# Patient Record
Sex: Male | Born: 2000 | Hispanic: Yes | Marital: Single | Smoking: Never smoker
Health system: Southern US, Community
[De-identification: ages and names within clinical notes are randomized; demographics above are authoritative.]

## PROBLEM LIST (undated history)

## (undated) DIAGNOSIS — Z952 Presence of prosthetic heart valve: Secondary | ICD-10-CM

## (undated) DIAGNOSIS — G43909 Migraine, unspecified, not intractable, without status migrainosus: Secondary | ICD-10-CM

## (undated) DIAGNOSIS — R569 Unspecified convulsions: Secondary | ICD-10-CM

## (undated) HISTORY — PX: CARDIAC SURGERY: SHX584

---

## 2020-11-25 ENCOUNTER — Emergency Department (HOSPITAL_COMMUNITY)
Admission: EM | Admit: 2020-11-25 | Discharge: 2020-11-25 | Disposition: A | Discharge status: Home / Self Care | Payer: Medicaid Other | Attending: Emergency Medicine | Admitting: Emergency Medicine

## 2020-11-25 ENCOUNTER — Encounter (HOSPITAL_COMMUNITY): Payer: Self-pay | Admitting: Emergency Medicine

## 2020-11-25 ENCOUNTER — Emergency Department (HOSPITAL_COMMUNITY): Payer: Medicaid Other

## 2020-11-25 DIAGNOSIS — R079 Chest pain, unspecified: Secondary | ICD-10-CM

## 2020-11-25 DIAGNOSIS — R011 Cardiac murmur, unspecified: Secondary | ICD-10-CM | POA: Insufficient documentation

## 2020-11-25 DIAGNOSIS — R0789 Other chest pain: Secondary | ICD-10-CM | POA: Diagnosis not present

## 2020-11-25 HISTORY — DX: Presence of prosthetic heart valve: Z95.2

## 2020-11-25 HISTORY — DX: Unspecified convulsions: R56.9

## 2020-11-25 LAB — BASIC METABOLIC PANEL
Anion gap: 7 (ref 5–15)
BUN: 15 mg/dL (ref 6–20)
CO2: 26 mmol/L (ref 22–32)
Calcium: 9.6 mg/dL (ref 8.9–10.3)
Chloride: 108 mmol/L (ref 98–111)
Creatinine, Ser: 0.84 mg/dL (ref 0.61–1.24)
GFR, Estimated: >60 mL/min (ref >60)
Glucose, Bld: 93 mg/dL (ref 70–99)
Potassium: 4.5 mmol/L (ref 3.5–5.1)
Sodium: 141 mmol/L (ref 135–145)

## 2020-11-25 LAB — CBC
HCT: 46.4 % (ref 39.0–52.0)
Hemoglobin: 15.9 g/dL (ref 13.0–17.0)
MCH: 29.9 pg (ref 26.0–34.0)
MCHC: 34.3 g/dL (ref 30.0–36.0)
MCV: 87.4 fL (ref 80.0–100.0)
Platelets: 228 10*3/uL (ref 150–400)
RBC: 5.31 MIL/uL (ref 4.22–5.81)
RDW: 12.4 % (ref 11.5–15.5)
WBC: 5.6 10*3/uL (ref 4.0–10.5)
nRBC: 0 % (ref 0.0–0.2)

## 2020-11-25 LAB — TROPONIN I (HIGH SENSITIVITY)
Troponin I (High Sensitivity): <2 ng/L (ref <18)
Troponin I (High Sensitivity): <2 ng/L (ref <18)

## 2020-11-25 LAB — BRAIN NATRIURETIC PEPTIDE: B Natriuretic Peptide: 37.4 pg/mL (ref 0.0–100.0)

## 2020-11-25 NOTE — ED Provider Notes (Signed)
[facility]-EMERGENCY DEPT Provider Note   CSN: [id number] Arrival date & time: 11/25/20  2505     History Chief Complaint  Patient presents with  . Chest Pain    Carl Calderon is a 20 y.o. male.  HPI  HPI: A 20 year old patient presents for evaluation of chest pain. Initial onset of pain was less than one hour ago. The patient's chest pain is sharp and is not worse with exertion. The patient's chest pain is middle- or left-sided, is not well-localized, is not described as heaviness/pressure/tightness and does not radiate to the arms/jaw/neck. The patient does not complain of nausea and denies diaphoresis. The patient has no history of stroke, has no history of peripheral artery disease, has not smoked in the past 90 days, denies any history of treated diabetes, has no relevant family history of coronary artery disease (first degree relative at less than age 68), is not hypertensive, has no history of hypercholesterolemia and does not have an elevated BMI (>=30).   Patient has history of bicuspid aortic valve, status post replacement when he was a child.  Patient has not seen a cardiologist in over 3 years.  He reports intermittent episodes of chest pain over the last 2 weeks that have become more pronounced.  The pain is left-sided.  The pain is not associated with exertion and come about randomly, even at rest.  The pain will last only for a minute or 2 and then resolve, only for her to return in few seconds.  No pleuritic component to the pain.  Patient denies any cough, fevers, chills, radiation of pain, shortness of breath, palpitations or near fainting.  Past Medical History:  Diagnosis Date  . Aortic valve replaced   . Seizures (HCC)     There are no problems to display for this patient.     No family history on file.  Social History   Tobacco Use  . Smoking status: Never Smoker  . Smokeless tobacco: Never Used  Substance Use  Topics  . Alcohol use: Never  . Drug use: Never    Home Medications Prior to Admission medications   Not on File    Allergies    Patient has no allergy information on record.  Review of Systems   Review of Systems  Constitutional: Positive for activity change.  Respiratory: Negative for shortness of breath.   Cardiovascular: Positive for chest pain. Negative for palpitations.  Neurological: Negative for dizziness.  All other systems reviewed and are negative.   Physical Exam Updated Vital Signs BP 129/76   Pulse (!) 58   Temp 98.2 F (36.8 C) (Oral)   Resp 13   Ht 6' (1.829 m)   Wt 90.7 kg   SpO2 100%   BMI 27.12 kg/m   Physical Exam Vitals and nursing note reviewed.  Constitutional:      Appearance: He is well-developed.  HENT:     Head: Atraumatic.  Cardiovascular:     Rate and Rhythm: Normal rate.     Heart sounds: Murmur heard.   Systolic murmur is present.   Pulmonary:     Effort: Pulmonary effort is normal.  Musculoskeletal:     Cervical back: Neck supple.  Skin:    General: Skin is warm.  Neurological:     Mental Status: He is alert and oriented to person, place, and time.     ED Results / Procedures / Treatments   Labs (all labs ordered are listed, but only  abnormal results are displayed) Labs Reviewed  BASIC METABOLIC PANEL  CBC  BRAIN NATRIURETIC PEPTIDE  TROPONIN I (HIGH SENSITIVITY)  TROPONIN I (HIGH SENSITIVITY)    EKG EKG Interpretation  Date/Time:  Tuesday November 25 2020 08:37:29 EDT Ventricular Rate:  78 PR Interval:  145 QRS Duration: 107 QT Interval:  369 QTC Calculation: 421 R Axis:   89 Text Interpretation: Sinus rhythm S1,S2,S3 pattern RSR' in V1 or V2, right VCD or RVH Borderline ST elevation, anterolateral leads [id number] No old tracing to compare Confirmed by Derwood Kaplan [phone]) on 11/25/2020 10:27:44 AM   Radiology DG Chest 2 View  Result Date: 11/25/2020 CLINICAL DATA:  Chest pain. EXAM: CHEST - 2 VIEW  COMPARISON:  No prior. FINDINGS: Mediastinum is normal. Borderline cardiac enlargement. No pleural effusion or pneumothorax. Thoracic spine scoliosis concave left. No acute bony abnormality. IMPRESSION: 1. Borderline cardiac enlargement. No acute cardiopulmonary disease otherwise noted. 2.  Mild thoracic spine scoliosis. Electronically Signed   By: Maisie Fus  Register   On: 11/25/2020 08:59    Procedures Procedures   Medications Ordered in ED Medications - No data to display  ED Course  I have reviewed the triage vital signs and the nursing notes.  Pertinent labs & imaging results that were available during my care of the patient were reviewed by me and considered in my medical decision making (see chart for details).    MDM Rules/Calculators/A&P HEAR Score: 1                        20 year old comes in a chief complaint of chest pain.  He has history of bicuspid aortic valve, status post replacement.  Has been having intermittent episodes of chest pain over the last 2 weeks that have progressed.  The pain is atypical.  However given his history, we will get troponins to ensure there is no myocardial injury.  Patient has pansystolic murmur.  I cannot review any prior echocardiogram.  He does not appear to be volume overloaded.  Chest x-ray ordered.  EKG showing possible right-sided strain.  Patient does not have any PE risk factors and his Wells score is 0, PERC negative.  We will not pursue PE as the work-up.  If the troponins are fine, we will advised patient to follow-up with cardiology service.  He will probably benefit with echocardiogram.  Final Clinical Impression(s) / ED Diagnoses Final diagnoses:  None    Rx / DC Orders ED Discharge Orders    None       Derwood Kaplan, MD 11/25/20 1042

## 2020-11-25 NOTE — ED Triage Notes (Signed)
Patient here from home reporting chest pain that started 1 week ago. Reports left chest tightness. Hx of aortic valve replacement and seizure disorder. Denies n/v.

## 2020-11-25 NOTE — Discharge Instructions (Signed)
We saw you in the ER for the chest pain.  All of our cardiac workup is normal, including labs, EKG and chest X-RAY are normal. We are not sure what is causing your discomfort, but we feel comfortable sending you home at this time. The workup in the ER is not complete, and you should follow up with Cardiologist as soon as possible.  Please return to the ER if you have worsening chest pain, shortness of breath, fainting.

## 2021-02-06 ENCOUNTER — Emergency Department (HOSPITAL_COMMUNITY): Payer: Medicaid Other

## 2021-02-06 ENCOUNTER — Other Ambulatory Visit: Payer: Self-pay

## 2021-02-06 ENCOUNTER — Encounter (HOSPITAL_COMMUNITY): Payer: Self-pay

## 2021-02-06 ENCOUNTER — Emergency Department (HOSPITAL_COMMUNITY)
Admission: EM | Admit: 2021-02-06 | Discharge: 2021-02-06 | Disposition: A | Discharge status: Home / Self Care | Payer: Medicaid Other | Attending: Emergency Medicine | Admitting: Emergency Medicine

## 2021-02-06 DIAGNOSIS — R569 Unspecified convulsions: Secondary | ICD-10-CM | POA: Insufficient documentation

## 2021-02-06 DIAGNOSIS — Z79899 Other long term (current) drug therapy: Secondary | ICD-10-CM | POA: Diagnosis not present

## 2021-02-06 HISTORY — DX: Migraine, unspecified, not intractable, without status migrainosus: G43.909

## 2021-02-06 LAB — COMPREHENSIVE METABOLIC PANEL
ALT: 23 U/L (ref 0–44)
AST: 21 U/L (ref 15–41)
Albumin: 4.4 g/dL (ref 3.5–5.0)
Alkaline Phosphatase: 47 U/L (ref 38–126)
Anion gap: 5 (ref 5–15)
BUN: 14 mg/dL (ref 6–20)
CO2: 24 mmol/L (ref 22–32)
Calcium: 9 mg/dL (ref 8.9–10.3)
Chloride: 109 mmol/L (ref 98–111)
Creatinine, Ser: 0.82 mg/dL (ref 0.61–1.24)
GFR, Estimated: >60 mL/min (ref >60)
Glucose, Bld: 109 mg/dL — ABNORMAL HIGH (ref 70–99)
Potassium: 3.9 mmol/L (ref 3.5–5.1)
Sodium: 138 mmol/L (ref 135–145)
Total Bilirubin: 0.9 mg/dL (ref 0.3–1.2)
Total Protein: 7.1 g/dL (ref 6.5–8.1)

## 2021-02-06 LAB — CBC WITH DIFFERENTIAL/PLATELET
Abs Immature Granulocytes: 0.03 10*3/uL (ref 0.00–0.07)
Basophils Absolute: 0 10*3/uL (ref 0.0–0.1)
Basophils Relative: 0 %
Eosinophils Absolute: 0.1 10*3/uL (ref 0.0–0.5)
Eosinophils Relative: 1 %
HCT: 42.9 % (ref 39.0–52.0)
Hemoglobin: 15.1 g/dL (ref 13.0–17.0)
Immature Granulocytes: 0 %
Lymphocytes Relative: 12 %
Lymphs Abs: 1.1 10*3/uL (ref 0.7–4.0)
MCH: 29.8 pg (ref 26.0–34.0)
MCHC: 35.2 g/dL (ref 30.0–36.0)
MCV: 84.6 fL (ref 80.0–100.0)
Monocytes Absolute: 0.5 10*3/uL (ref 0.1–1.0)
Monocytes Relative: 5 %
Neutro Abs: 7.6 10*3/uL (ref 1.7–7.7)
Neutrophils Relative %: 82 %
Platelets: 213 10*3/uL (ref 150–400)
RBC: 5.07 MIL/uL (ref 4.22–5.81)
RDW: 12.7 % (ref 11.5–15.5)
WBC: 9.4 10*3/uL (ref 4.0–10.5)
nRBC: 0 % (ref 0.0–0.2)

## 2021-02-06 LAB — CBG MONITORING, ED: Glucose-Capillary: 95 mg/dL (ref 70–99)

## 2021-02-06 LAB — VALPROIC ACID LEVEL: Valproic Acid Lvl: <10 ug/mL — ABNORMAL LOW (ref 50.0–100.0)

## 2021-02-06 MED ORDER — KETOROLAC TROMETHAMINE 30 MG/ML IJ SOLN
30.0000 mg | Freq: Once | INTRAMUSCULAR | Status: AC
  Administered 2021-02-06: 30 mg via INTRAVENOUS
  Filled 2021-02-06: qty 1

## 2021-02-06 MED ORDER — DIVALPROEX SODIUM ER 250 MG PO TB24: 750.0000 mg | ORAL_TABLET | Freq: Every day | ORAL | 0 refills | Status: DC

## 2021-02-06 MED ORDER — VALPROIC ACID 250 MG PO CAPS
750.0000 mg | ORAL_CAPSULE | Freq: Once | ORAL | Status: AC
  Administered 2021-02-06: 750 mg via ORAL
  Filled 2021-02-06: qty 3

## 2021-02-06 NOTE — ED Provider Notes (Signed)
[facility] Provider Note   CSN: [id number] Arrival date & time: 02/06/21  1125     History Chief Complaint  Patient presents with   Seizures    Carl Calderon is a 20 y.o. male.  HPI Patient is a 20 year old male with past medical history significant for epilepsy takes 750 of Depakote daily at bedtime.  States that he missed a dose last night he states that he misses a dose approximately every other month.  He states that today while at the barbershop just prior to arrival in the ER he was sitting in the chair when he started having jerking on both arms.  He states it was his entire body states that he then does not remember anything other than waking up in the ambulance.  He states that he was told that he passed out he did hit his head against the ground is endorsing some headache and neck pain.  No chest pain or shortness of breath no lightheadedness or dizziness. No nausea or vomiting.  No significant other associate symptoms.  Aggravating mitigating factors.    Past Medical History:  Diagnosis Date   Aortic valve replaced    Migraine    Seizures (HCC)     There are no problems to display for this patient.   Past Surgical History:  Procedure Laterality Date   CARDIAC SURGERY         History reviewed. No pertinent family history.  Social History   Tobacco Use   Smoking status: Never   Smokeless tobacco: Never  Vaping Use   Vaping Use: Never used  Substance Use Topics   Alcohol use: Yes    Comment: every once in awhile   Drug use: Never    Home Medications Prior to Admission medications   Medication Sig Start Date End Date Taking? Authorizing Provider  divalproex (DEPAKOTE ER) 250 MG 24 hr tablet Take 3 tablets (750 mg total) by mouth daily. 02/06/21 03/08/21 Yes Lis Savitt S, PA  divalproex (DEPAKOTE) 250 MG DR tablet Take 250 mg by mouth at bedtime. 10/27/20   [provider]  divalproex  (DEPAKOTE) 500 MG DR tablet Take 500 mg by mouth at bedtime. 10/27/20   [provider]  topiramate (TOPAMAX) 25 MG tablet Take 25 tablets by mouth at bedtime. 06/24/20   [provider]    Allergies    Patient has no known allergies.  Review of Systems   Review of Systems  Constitutional:  Negative for chills and fever.  HENT:  Negative for congestion.   Eyes:  Negative for pain.  Respiratory:  Negative for cough and shortness of breath.   Cardiovascular:  Negative for chest pain and leg swelling.  Gastrointestinal:  Negative for abdominal distention, abdominal pain and vomiting.  Genitourinary:  Negative for dysuria.  Musculoskeletal:  Positive for neck pain. Negative for myalgias.  Skin:  Negative for rash.  Neurological:  Positive for seizures and headaches. Negative for dizziness.   Physical Exam Updated Vital Signs BP 119/61   Pulse 80   Temp 98.7 F (37.1 C) (Oral)   Resp 14   Ht 6' (1.829 m)   Wt 90.7 kg   SpO2 100%   BMI 27.12 kg/m   Physical Exam Vitals and nursing note reviewed.  Constitutional:      General: He is not in acute distress. HENT:     Head: Normocephalic.     Comments: There is some bruising overlying the bridge  of the nose with no significant tenderness and some abrasions and bruising to the left forehead.    Nose: Nose normal.     Mouth/Throat:     Mouth: Mucous membranes are moist.  Eyes:     General: No scleral icterus. Cardiovascular:     Rate and Rhythm: Normal rate and regular rhythm.     Pulses: Normal pulses.     Heart sounds: Normal heart sounds.  Pulmonary:     Effort: Pulmonary effort is normal. No respiratory distress.     Breath sounds: No wheezing.  Abdominal:     Palpations: Abdomen is soft.     Tenderness: There is no abdominal tenderness.  Musculoskeletal:     Cervical back: Normal range of motion.     Right lower leg: No edema.     Left lower leg: No edema.  Skin:    General: Skin is warm and dry.      Capillary Refill: Capillary refill takes less than 2 seconds.  Neurological:     Mental Status: He is alert. Mental status is at baseline.     Comments: Alert and oriented to self, place, time and event.   Speech is fluent, clear without dysarthria or dysphasia.   Strength 5/5 in upper/lower extremities   Sensation intact in upper/lower extremities   Normal finger-to-nose and feet tapping.  CN I not tested  CN II grossly intact visual fields bilaterally. Did not visualize posterior eye.  CN III, IV, VI PERRLA and EOMs intact bilaterally  CN V Intact sensation to sharp and light touch to the face  CN VII facial movements symmetric  CN VIII not tested  CN IX, X no uvula deviation, symmetric rise of soft palate  CN XI 5/5 SCM and trapezius strength bilaterally  CN XII Midline tongue protrusion, symmetric L/R movements   Psychiatric:        Mood and Affect: Mood normal.        Behavior: Behavior normal.    ED Results / Procedures / Treatments   Labs (all labs ordered are listed, but only abnormal results are displayed) Labs Reviewed  VALPROIC ACID LEVEL - Abnormal; Notable for the following components:      Result Value   Valproic Acid Lvl <10 (*)    All other components within normal limits  COMPREHENSIVE METABOLIC PANEL - Abnormal; Notable for the following components:   Glucose, Bld 109 (*)    All other components within normal limits  CBC WITH DIFFERENTIAL/PLATELET  CBG MONITORING, ED    EKG EKG Interpretation  Date/Time:  Friday February 06 2021 12:48:19 EDT Ventricular Rate:  84 PR Interval:  166 QRS Duration: 112 QT Interval:  348 QTC Calculation: 411 R Axis:   106 Text Interpretation: Normal sinus rhythm Rightward axis Pulmonary disease pattern Incomplete right bundle branch block diffuse ST elevation c/w early repol No old tracing to compare Confirmed by Pricilla Loveless [phone]) on 02/06/2021 2:01:23 PM  Radiology CT Head Wo Contrast  Result Date:  02/06/2021 CLINICAL DATA:  Head injury after seizure and fall. EXAM: CT HEAD WITHOUT CONTRAST CT CERVICAL SPINE WITHOUT CONTRAST TECHNIQUE: Multidetector CT imaging of the head and cervical spine was performed following the standard protocol without intravenous contrast. Multiplanar CT image reconstructions of the cervical spine were also generated. COMPARISON:  None. FINDINGS: CT HEAD FINDINGS Brain: No evidence of acute infarction, hemorrhage, hydrocephalus, extra-axial collection or mass lesion/mass effect. Vascular: No hyperdense vessel or unexpected calcification. Skull: Normal. Negative for fracture or  focal lesion. Sinuses/Orbits: No acute finding. Other: None. CT CERVICAL SPINE FINDINGS Alignment: Normal. Skull base and vertebrae: No acute fracture. No primary bone lesion or focal pathologic process. Soft tissues and spinal canal: No prevertebral fluid or swelling. No visible canal hematoma. Disc levels:  Normal. Upper chest: Negative. Other: None. IMPRESSION: Normal head CT. Normal cervical spine. Electronically Signed   By: Lupita Raider M.D.   On: 02/06/2021 13:41   CT Cervical Spine Wo Contrast  Result Date: 02/06/2021 CLINICAL DATA:  Head injury after seizure and fall. EXAM: CT HEAD WITHOUT CONTRAST CT CERVICAL SPINE WITHOUT CONTRAST TECHNIQUE: Multidetector CT imaging of the head and cervical spine was performed following the standard protocol without intravenous contrast. Multiplanar CT image reconstructions of the cervical spine were also generated. COMPARISON:  None. FINDINGS: CT HEAD FINDINGS Brain: No evidence of acute infarction, hemorrhage, hydrocephalus, extra-axial collection or mass lesion/mass effect. Vascular: No hyperdense vessel or unexpected calcification. Skull: Normal. Negative for fracture or focal lesion. Sinuses/Orbits: No acute finding. Other: None. CT CERVICAL SPINE FINDINGS Alignment: Normal. Skull base and vertebrae: No acute fracture. No primary bone lesion or focal  pathologic process. Soft tissues and spinal canal: No prevertebral fluid or swelling. No visible canal hematoma. Disc levels:  Normal. Upper chest: Negative. Other: None. IMPRESSION: Normal head CT. Normal cervical spine. Electronically Signed   By: Lupita Raider M.D.   On: 02/06/2021 13:41    Procedures Procedures   Medications Ordered in ED Medications  valproic acid (DEPAKENE) 250 MG capsule 750 mg (has no administration in time range)  ketorolac (TORADOL) 30 MG/ML injection 30 mg (has no administration in time range)    ED Course  I have reviewed the triage vital signs and the nursing notes.  Pertinent labs & imaging results that were available during my care of the patient were reviewed by me and considered in my medical decision making (see chart for details).  Clinical Course as of 02/06/21 1420  Fri Feb 06, 2021  1402 CT head/cspine Without ABN [WF]    Clinical Course User Index [WF] Gailen Shelter, PA   MDM Rules/Calculators/A&P                          Patient is 20 year old male presenting today for symptoms consistent with a seizure that occurred earlier today.  He did fall forward and strike his head against the ground.  He is currently asymptomatic this time apart from headache which has improved significantly.  Neurologically intact on examination.  Does have some bruising around the left forehead and an abrasion to the bridge of his nose.  CT head and CT C-spine without abnormality.  CMP CBC unremarkable.  Valproate level is undetectably low.  I reviewed patient's medication it appears that he is on the immediate release version of Depakote and I suspect the extended release to be more helpful for daytime coverage.  He has not seen a neurologist in several years.  Recommended follow-up with [facility] or [facility] neurology.  Return precautions given including additional seizure activity.  Patient has been without any seizures here in the ER.  Given a dose of Depakote  prior to discharge.  Final Clinical Impression(s) / ED Diagnoses Final diagnoses:  Seizure (HCC)    Rx / DC Orders ED Discharge Orders          Ordered    divalproex (DEPAKOTE ER) 250 MG 24 hr tablet  Daily  02/06/21 1413             Gailen Shelter, [place] 02/06/21 1455    Pricilla Loveless, MD 02/07/21 1538

## 2021-02-06 NOTE — ED Triage Notes (Signed)
Per EMS pt complaining for barbershop and had a seizure that lasted 3 minutes. Pt fell and hit his forehead. Abrasion to forehead. Hx of seizure on medication. No seizure activity with EMS.  Pt had n/v with EMS with 4mg  Zofran given.   20G IV LFA  EMS VS BP 190/120, HR 110, CBG 111, O2 98%

## 2021-02-06 NOTE — Discharge Instructions (Addendum)
Please begin taking the Depakote that I prescribed you tomorrow Please drink plenty of water. Please follow-up with a neurologist.  I given you the information for 2 different neurologist.  Please get plenty of rest.  Avoid any alcohol for the time being.  You may always return to the ER for any new or concerning symptoms including additional seizures.

## 2021-03-09 ENCOUNTER — Other Ambulatory Visit: Payer: Self-pay

## 2021-03-09 ENCOUNTER — Emergency Department (HOSPITAL_COMMUNITY)
Admission: EM | Admit: 2021-03-09 | Discharge: 2021-03-10 | Disposition: A | Discharge status: Home / Self Care | Payer: Medicaid Other | Attending: Emergency Medicine | Admitting: Emergency Medicine

## 2021-03-09 ENCOUNTER — Emergency Department (HOSPITAL_COMMUNITY): Payer: Medicaid Other

## 2021-03-09 ENCOUNTER — Encounter (HOSPITAL_COMMUNITY): Payer: Self-pay

## 2021-03-09 DIAGNOSIS — R197 Diarrhea, unspecified: Secondary | ICD-10-CM | POA: Insufficient documentation

## 2021-03-09 DIAGNOSIS — R1031 Right lower quadrant pain: Secondary | ICD-10-CM | POA: Insufficient documentation

## 2021-03-09 DIAGNOSIS — R112 Nausea with vomiting, unspecified: Secondary | ICD-10-CM | POA: Insufficient documentation

## 2021-03-09 DIAGNOSIS — R1032 Left lower quadrant pain: Secondary | ICD-10-CM | POA: Diagnosis not present

## 2021-03-09 LAB — CBC WITH DIFFERENTIAL/PLATELET
Abs Immature Granulocytes: 0.02 10*3/uL (ref 0.00–0.07)
Basophils Absolute: 0 10*3/uL (ref 0.0–0.1)
Basophils Relative: 0 %
Eosinophils Absolute: 0 10*3/uL (ref 0.0–0.5)
Eosinophils Relative: 0 %
HCT: 46.4 % (ref 39.0–52.0)
Hemoglobin: 16.3 g/dL (ref 13.0–17.0)
Immature Granulocytes: 0 %
Lymphocytes Relative: 13 %
Lymphs Abs: 0.7 10*3/uL (ref 0.7–4.0)
MCH: 30.2 pg (ref 26.0–34.0)
MCHC: 35.1 g/dL (ref 30.0–36.0)
MCV: 85.9 fL (ref 80.0–100.0)
Monocytes Absolute: 0.5 10*3/uL (ref 0.1–1.0)
Monocytes Relative: 10 %
Neutro Abs: 4 10*3/uL (ref 1.7–7.7)
Neutrophils Relative %: 77 %
Platelets: 222 10*3/uL (ref 150–400)
RBC: 5.4 MIL/uL (ref 4.22–5.81)
RDW: 12.6 % (ref 11.5–15.5)
WBC: 5.2 10*3/uL (ref 4.0–10.5)
nRBC: 0 % (ref 0.0–0.2)

## 2021-03-09 LAB — LIPASE, BLOOD: Lipase: 26 U/L (ref 11–51)

## 2021-03-09 LAB — COMPREHENSIVE METABOLIC PANEL
ALT: 20 U/L (ref 0–44)
AST: 20 U/L (ref 15–41)
Albumin: 5 g/dL (ref 3.5–5.0)
Alkaline Phosphatase: 50 U/L (ref 38–126)
Anion gap: 11 (ref 5–15)
BUN: 17 mg/dL (ref 6–20)
CO2: 28 mmol/L (ref 22–32)
Calcium: 10.2 mg/dL (ref 8.9–10.3)
Chloride: 100 mmol/L (ref 98–111)
Creatinine, Ser: 0.9 mg/dL (ref 0.61–1.24)
GFR, Estimated: >60 mL/min (ref >60)
Glucose, Bld: 102 mg/dL — ABNORMAL HIGH (ref 70–99)
Potassium: 4 mmol/L (ref 3.5–5.1)
Sodium: 139 mmol/L (ref 135–145)
Total Bilirubin: 2.4 mg/dL — ABNORMAL HIGH (ref 0.3–1.2)
Total Protein: 8.4 g/dL — ABNORMAL HIGH (ref 6.5–8.1)

## 2021-03-09 LAB — URINALYSIS, ROUTINE W REFLEX MICROSCOPIC
Bilirubin Urine: NEGATIVE
Glucose, UA: NEGATIVE mg/dL
Hgb urine dipstick: NEGATIVE
Ketones, ur: 5 mg/dL — AB
Leukocytes,Ua: NEGATIVE
Nitrite: NEGATIVE
Protein, ur: NEGATIVE mg/dL
Specific Gravity, Urine: 1.031 — ABNORMAL HIGH (ref 1.005–1.030)
pH: 5 (ref 5.0–8.0)

## 2021-03-09 MED ORDER — ONDANSETRON HCL 4 MG PO TABS: 4.0000 mg | ORAL_TABLET | Freq: Four times a day (QID) | ORAL | 0 refills | Status: DC

## 2021-03-09 MED ORDER — ONDANSETRON HCL 4 MG/2ML IJ SOLN
4.0000 mg | Freq: Once | INTRAMUSCULAR | Status: AC
  Administered 2021-03-09: 4 mg via INTRAVENOUS
  Filled 2021-03-09: qty 2

## 2021-03-09 MED ORDER — SODIUM CHLORIDE 0.9 % IV BOLUS
1000.0000 mL | Freq: Once | INTRAVENOUS | Status: AC
  Administered 2021-03-09: 1000 mL via INTRAVENOUS

## 2021-03-09 MED ORDER — IOHEXOL 350 MG/ML SOLN
80.0000 mL | Freq: Once | INTRAVENOUS | Status: AC | PRN
  Administered 2021-03-09: 80 mL via INTRAVENOUS

## 2021-03-09 NOTE — ED Notes (Signed)
Pt back from CT

## 2021-03-09 NOTE — Discharge Instructions (Addendum)
Overall suspect that you do have some food poisoning.  Lab work and imaging today was unremarkable.  Suspect that symptoms will resolve on their own.  Take Zofran as needed for nausea.

## 2021-03-09 NOTE — ED Notes (Signed)
Patient transported to CT 

## 2021-03-09 NOTE — ED Triage Notes (Signed)
Pt reports abdomen pain since yesterday after eating at 3:30pm. Pt states he has been vomiting, not able to keep food/drink down.  Pt reports headache and feeling like he is going to pass out when vomiting.

## 2021-03-09 NOTE — ED Provider Notes (Signed)
[facility]-EMERGENCY DEPT Provider Note   CSN: [id number] Arrival date & time: 03/09/21  1711     History Chief Complaint  Patient presents with   Abdominal Pain    Carl Calderon is a 20 y.o. male.  The history is provided by the patient.  Abdominal Pain Pain location:  LLQ and RLQ Pain quality: aching   Pain radiates to:  Does not radiate Pain severity:  Mild Onset quality:  Gradual Duration:  2 days Timing:  Constant Progression:  Worsening Chronicity:  New Context: suspicious food intake (possibly)   Relieved by:  Nothing Worsened by:  Nothing Associated symptoms: diarrhea (twice), nausea and vomiting   Associated symptoms: no anorexia, no chest pain, no chills, no constipation, no cough, no dysuria, no fever, no hematuria, no shortness of breath and no sore throat   Risk factors: has not had multiple surgeries       Past Medical History:  Diagnosis Date   Aortic valve replaced    Migraine    Seizures (HCC)     There are no problems to display for this patient.   Past Surgical History:  Procedure Laterality Date   CARDIAC SURGERY         History reviewed. No pertinent family history.  Social History   Tobacco Use   Smoking status: Never   Smokeless tobacco: Never  Vaping Use   Vaping Use: Never used  Substance Use Topics   Alcohol use: Yes    Comment: every once in awhile   Drug use: Never    Home Medications Prior to Admission medications   Medication Sig Start Date End Date Taking? Authorizing Provider  ondansetron (ZOFRAN) 4 MG tablet Take 1 tablet (4 mg total) by mouth every 6 (six) hours. 03/09/21  Yes Eleno Weimar, DO  divalproex (DEPAKOTE ER) 250 MG 24 hr tablet Take 3 tablets (750 mg total) by mouth daily. 02/06/21 03/08/21  Gailen Shelter, PA  divalproex (DEPAKOTE) 250 MG DR tablet Take 250 mg by mouth at bedtime. 10/27/20   [provider]  divalproex (DEPAKOTE) 500 MG DR tablet Take 500  mg by mouth at bedtime. 10/27/20   [provider]  topiramate (TOPAMAX) 25 MG tablet Take 25 tablets by mouth at bedtime. 06/24/20   [provider]    Allergies    Patient has no known allergies.  Review of Systems   Review of Systems  Constitutional:  Negative for chills and fever.  HENT:  Negative for ear pain and sore throat.   Eyes:  Negative for pain and visual disturbance.  Respiratory:  Negative for cough and shortness of breath.   Cardiovascular:  Negative for chest pain and palpitations.  Gastrointestinal:  Positive for abdominal pain, diarrhea (twice), nausea and vomiting. Negative for anorexia and constipation.  Genitourinary:  Negative for dysuria and hematuria.  Musculoskeletal:  Negative for arthralgias and back pain.  Skin:  Negative for color change and rash.  Neurological:  Negative for seizures and syncope.  All other systems reviewed and are negative.  Physical Exam Updated Vital Signs BP 109/73   Pulse 63   Temp 98.3 F (36.8 C) (Oral)   Resp 18   Ht 6' (1.829 m)   Wt 104.3 kg   SpO2 98%   BMI 31.19 kg/m   Physical Exam Vitals and nursing note reviewed.  Constitutional:      General: He is not in acute distress.    Appearance: He is well-developed.  He is not ill-appearing.  HENT:     Head: Normocephalic and atraumatic.     Mouth/Throat:     Mouth: Mucous membranes are moist.  Eyes:     Extraocular Movements: Extraocular movements intact.     Conjunctiva/sclera: Conjunctivae normal.     Pupils: Pupils are equal, round, and reactive to light.  Cardiovascular:     Rate and Rhythm: Normal rate and regular rhythm.     Heart sounds: Normal heart sounds. No murmur heard. Pulmonary:     Effort: Pulmonary effort is normal. No respiratory distress.     Breath sounds: Normal breath sounds.  Abdominal:     General: There is no distension.     Palpations: Abdomen is soft.     Tenderness: There is abdominal tenderness in the right lower  quadrant and suprapubic area. There is no guarding or rebound.  Musculoskeletal:     Cervical back: Neck supple.  Skin:    General: Skin is warm and dry.     Capillary Refill: Capillary refill takes less than 2 seconds.  Neurological:     General: No focal deficit present.     Mental Status: He is alert.    ED Results / Procedures / Treatments   Labs (all labs ordered are listed, but only abnormal results are displayed) Labs Reviewed  COMPREHENSIVE METABOLIC PANEL - Abnormal; Notable for the following components:      Result Value   Glucose, Bld 102 (*)    Total Protein 8.4 (*)    Total Bilirubin 2.4 (*)    All other components within normal limits  URINALYSIS, ROUTINE W REFLEX MICROSCOPIC - Abnormal; Notable for the following components:   APPearance HAZY (*)    Specific Gravity, Urine 1.031 (*)    Ketones, ur 5 (*)    All other components within normal limits  CBC WITH DIFFERENTIAL/PLATELET  LIPASE, BLOOD    EKG None  Radiology CT ABDOMEN PELVIS W CONTRAST  Result Date: 03/09/2021 CLINICAL DATA:  Right lower quadrant pain, nausea, vomiting and diarrhea EXAM: CT ABDOMEN AND PELVIS WITH CONTRAST TECHNIQUE: Multidetector CT imaging of the abdomen and pelvis was performed using the standard protocol following bolus administration of intravenous contrast. CONTRAST:  1mL OMNIPAQUE IOHEXOL 350 MG/ML SOLN COMPARISON:  None. FINDINGS: Lower chest: Lung bases are clear. Normal heart size. No pericardial effusion. Hepatobiliary: No worrisome focal liver lesions. Smooth liver surface contour. Normal hepatic attenuation. Normal gallbladder and biliary tree. Pancreas: No pancreatic ductal dilatation or surrounding inflammatory changes. Spleen: Normal in size. No concerning splenic lesions. Adrenals/Urinary Tract: Normal adrenal glands. Kidneys are normally located with symmetric enhancement. No suspicious renal lesion, urolithiasis or hydronephrosis. Urinary bladder unremarkable for degree of  distension. Stomach/Bowel: Distal esophagus, stomach and duodenal sweep are unremarkable. No small bowel wall thickening or dilatation. No evidence of obstruction. A normal appendix is visualized. No colonic dilatation or wall thickening. Vascular/Lymphatic: No significant vascular findings are present. No enlarged abdominal or pelvic lymph nodes. Reproductive: The prostate and seminal vesicles are unremarkable. Other: No abdominopelvic free fluid or free gas. No bowel containing hernias. Musculoskeletal: No acute osseous abnormality or suspicious osseous lesion. Transitional lumbosacral anatomy, anatomic variant. IMPRESSION: Acute no acute CT abnormality provide a cause for patient's symptoms. Specifically, normal appendix in the right lower quadrant. Electronically Signed   By: Kreg Shropshire M.D.   On: 03/09/2021 22:53    Procedures Procedures   Medications Ordered in ED Medications  sodium chloride 0.9 % bolus 1,000 mL (1,000  mLs Intravenous New Bag/Given 03/09/21 2213)  ondansetron (ZOFRAN) injection 4 mg (4 mg Intravenous Given 03/09/21 2213)  iohexol (OMNIPAQUE) 350 MG/ML injection 80 mL (80 mLs Intravenous Contrast Given 03/09/21 2233)    ED Course  I have reviewed the triage vital signs and the nursing notes.  Pertinent labs & imaging results that were available during my care of the patient were reviewed by me and considered in my medical decision making (see chart for details).    MDM Rules/Calculators/A&P                           Carl Calderon is here for nausea, vomiting, diarrhea.  Normal vitals.  No fever.  Tenderness in the right lower quadrant.  Concern for possible appendicitis but suspect foodborne poisoning.  No testicular pain.  Lab work showed no significant anemia, electrolyte abnormality here kidney injury.  CT scan showed no evidence of appendicitis or colitis.  No evidence of cholecystitis.  Overall suspect foodborne poisoning.  No UTI.  Felt better after IV  fluids and IV Zofran.  Discharged in ED in good condition.  Prescribe Zofran.  This chart was dictated using voice recognition software.  Despite best efforts to proofread,  errors can occur which can change the documentation meaning.   Final Clinical Impression(s) / ED Diagnoses Final diagnoses:  Nausea vomiting and diarrhea    Rx / DC Orders ED Discharge Orders          Ordered    ondansetron (ZOFRAN) 4 MG tablet  Every 6 hours        03/09/21 2256             Virgina Norfolk, DO 03/09/21 2258

## 2021-03-09 NOTE — ED Provider Notes (Signed)
Emergency Medicine Provider Triage Evaluation Note  Carl Calderon , a 20 y.o. male  was evaluated in triage.  Pt complains of nvd and abd pain. Had [place] food yesterday.  Review of Systems  Positive: Nvd, abd pain Negative: fever  Physical Exam  BP (!) 130/97   Pulse 87   Temp 98.6 F (37 C) (Oral)   Resp 18   Ht 6' (1.829 m)   Wt 104.3 kg   SpO2 99%   BMI 31.19 kg/m  Gen:   Awake, no distress   Resp:  Normal effort  MSK:   Moves extremities without difficulty  Other:    Medical Decision Making  Medically screening exam initiated at 5:49 PM.  Appropriate orders placed.  Carl Calderon was informed that the remainder of the evaluation will be completed by another provider, this initial triage assessment does not replace that evaluation, and the importance of remaining in the ED until their evaluation is complete.     Karrie Meres, PA-C 03/09/21 1749    Milagros Loll, MD 03/10/21 [phone]

## 2021-07-05 ENCOUNTER — Encounter (HOSPITAL_COMMUNITY): Payer: Self-pay

## 2021-07-05 ENCOUNTER — Other Ambulatory Visit: Payer: Self-pay

## 2021-07-05 ENCOUNTER — Emergency Department (HOSPITAL_COMMUNITY)
Admission: EM | Admit: 2021-07-05 | Discharge: 2021-07-05 | Disposition: A | Discharge status: Home / Self Care | Payer: Medicaid Other | Attending: Emergency Medicine | Admitting: Emergency Medicine

## 2021-07-05 DIAGNOSIS — R569 Unspecified convulsions: Secondary | ICD-10-CM | POA: Diagnosis present

## 2021-07-05 DIAGNOSIS — R400 Somnolence: Secondary | ICD-10-CM | POA: Insufficient documentation

## 2021-07-05 LAB — CBC WITH DIFFERENTIAL/PLATELET
Abs Immature Granulocytes: 0.04 10*3/uL (ref 0.00–0.07)
Basophils Absolute: 0 10*3/uL (ref 0.0–0.1)
Basophils Relative: 0 %
Eosinophils Absolute: 0 10*3/uL (ref 0.0–0.5)
Eosinophils Relative: 0 %
HCT: 44.3 % (ref 39.0–52.0)
Hemoglobin: 15 g/dL (ref 13.0–17.0)
Immature Granulocytes: 0 %
Lymphocytes Relative: 10 %
Lymphs Abs: 1.1 10*3/uL (ref 0.7–4.0)
MCH: 29.2 pg (ref 26.0–34.0)
MCHC: 33.9 g/dL (ref 30.0–36.0)
MCV: 86.2 fL (ref 80.0–100.0)
Monocytes Absolute: 0.8 10*3/uL (ref 0.1–1.0)
Monocytes Relative: 7 %
Neutro Abs: 9.3 10*3/uL — ABNORMAL HIGH (ref 1.7–7.7)
Neutrophils Relative %: 83 %
Platelets: 232 10*3/uL (ref 150–400)
RBC: 5.14 MIL/uL (ref 4.22–5.81)
RDW: 12.2 % (ref 11.5–15.5)
WBC: 11.2 10*3/uL — ABNORMAL HIGH (ref 4.0–10.5)
nRBC: 0 % (ref 0.0–0.2)

## 2021-07-05 LAB — COMPREHENSIVE METABOLIC PANEL
ALT: 42 U/L (ref 0–44)
AST: 27 U/L (ref 15–41)
Albumin: 4.5 g/dL (ref 3.5–5.0)
Alkaline Phosphatase: 47 U/L (ref 38–126)
Anion gap: 7 (ref 5–15)
BUN: 15 mg/dL (ref 6–20)
CO2: 27 mmol/L (ref 22–32)
Calcium: 9.4 mg/dL (ref 8.9–10.3)
Chloride: 105 mmol/L (ref 98–111)
Creatinine, Ser: 0.95 mg/dL (ref 0.61–1.24)
GFR, Estimated: >60 mL/min (ref >60)
Glucose, Bld: 130 mg/dL — ABNORMAL HIGH (ref 70–99)
Potassium: 4.1 mmol/L (ref 3.5–5.1)
Sodium: 139 mmol/L (ref 135–145)
Total Bilirubin: 0.9 mg/dL (ref 0.3–1.2)
Total Protein: 7.5 g/dL (ref 6.5–8.1)

## 2021-07-05 LAB — CBG MONITORING, ED: Glucose-Capillary: 139 mg/dL — ABNORMAL HIGH (ref 70–99)

## 2021-07-05 LAB — VALPROIC ACID LEVEL: Valproic Acid Lvl: 27 ug/mL — ABNORMAL LOW (ref 50.0–100.0)

## 2021-07-05 LAB — ETHANOL: Alcohol, Ethyl (B): <10 mg/dL (ref <10)

## 2021-07-05 MED ORDER — SODIUM CHLORIDE 0.9 % IV BOLUS
1000.0000 mL | Freq: Once | INTRAVENOUS | Status: AC
  Administered 2021-07-05: 04:00:00 1000 mL via INTRAVENOUS

## 2021-07-05 MED ORDER — DIVALPROEX SODIUM ER 500 MG PO TB24
750.0000 mg | ORAL_TABLET | Freq: Every day | ORAL | Status: DC
  Administered 2021-07-05: 06:00:00 750 mg via ORAL
  Filled 2021-07-05: qty 1

## 2021-07-05 NOTE — ED Provider Notes (Signed)
[facility]-EMERGENCY DEPT Provider Note   CSN: [id number] Arrival date & time: 07/05/21  6811     History Chief Complaint  Patient presents with   pseudo seizures    Carl Calderon is a 20 y.o. male.  Patient is a 20 year old male with history of seizures, migraines.  Patient presenting today for evaluation of seizure activity.  He was apparently hanging out with several friends when he began shaking all over.  The shaking initiated in his legs, then moved to the rest of his body.  According to his girlfriend who witnessed the event, this episode lasted for approximately 5 minutes, then resolved spontaneously.  She reports that he was conscious and speaking during the episode.  Patient denies alcohol or drug use.  He denies any recent illness.  No fevers or chills.  The history is provided by the patient.      Past Medical History:  Diagnosis Date   Aortic valve replaced    Migraine    Seizures (HCC)     There are no problems to display for this patient.   Past Surgical History:  Procedure Laterality Date   CARDIAC SURGERY         History reviewed. No pertinent family history.  Social History   Tobacco Use   Smoking status: Never   Smokeless tobacco: Never  Vaping Use   Vaping Use: Never used  Substance Use Topics   Alcohol use: Yes    Comment: every once in awhile   Drug use: Never    Home Medications Prior to Admission medications   Medication Sig Start Date End Date Taking? Authorizing Provider  divalproex (DEPAKOTE ER) 250 MG 24 hr tablet Take 3 tablets (750 mg total) by mouth daily. 02/06/21 03/08/21  Gailen Shelter, PA  divalproex (DEPAKOTE) 250 MG DR tablet Take 250 mg by mouth at bedtime. 10/27/20   [provider]  divalproex (DEPAKOTE) 500 MG DR tablet Take 500 mg by mouth at bedtime. 10/27/20   [provider]  ondansetron (ZOFRAN) 4 MG tablet Take 1 tablet (4 mg total) by mouth every 6 (six)  hours. 03/09/21   Curatolo, Adam, DO  topiramate (TOPAMAX) 25 MG tablet Take 25 tablets by mouth at bedtime. 06/24/20   [provider]    Allergies    Patient has no known allergies.  Review of Systems   Review of Systems  All other systems reviewed and are negative.  Physical Exam Updated Vital Signs BP (!) 153/82 (BP Location: Right Arm)   Pulse (!) 130   Temp 99.5 F (37.5 C) (Oral)   Resp 16   SpO2 99%   Physical Exam Vitals and nursing note reviewed.  Constitutional:      General: He is not in acute distress.    Appearance: He is well-developed. He is not diaphoretic.     Comments: Patient is somnolent, but arousable and appropriate.  He follows commands normally.  He moves all 4 extremities.  HENT:     Head: Normocephalic and atraumatic.  Cardiovascular:     Rate and Rhythm: Normal rate and regular rhythm.     Heart sounds: No murmur heard.   No friction rub.  Pulmonary:     Effort: Pulmonary effort is normal. No respiratory distress.     Breath sounds: Normal breath sounds. No wheezing or rales.  Abdominal:     General: Bowel sounds are normal. There is no distension.     Palpations: Abdomen  is soft.     Tenderness: There is no abdominal tenderness.  Musculoskeletal:        General: Normal range of motion.     Cervical back: Normal range of motion and neck supple.  Skin:    General: Skin is warm and dry.  Neurological:     General: No focal deficit present.     Mental Status: He is oriented to person, place, and time.     Cranial Nerves: No cranial nerve deficit.     Motor: No weakness.     Coordination: Coordination normal.    ED Results / Procedures / Treatments   Labs (all labs ordered are listed, but only abnormal results are displayed) Labs Reviewed  CBG MONITORING, ED - Abnormal; Notable for the following components:      Result Value   Glucose-Capillary 139 (*)    All other components within normal limits     EKG None  Radiology No results found.  Procedures Procedures   Medications Ordered in ED Medications  sodium chloride 0.9 % bolus 1,000 mL (has no administration in time range)    ED Course  I have reviewed the triage vital signs and the nursing notes.  Pertinent labs & imaging results that were available during my care of the patient were reviewed by me and considered in my medical decision making (see chart for details).    MDM Rules/Calculators/A&P  Patient brought here for evaluation after an apparent seizure that occurred this evening.  He was with friends at the time that it occurred and the episode was witnessed by his girlfriend.  By the time he arrived here, the episode had resolved and he is now back to his baseline.  Work-up shows no laboratory abnormality.  His Depakote level is 27 which is subtherapeutic.  Upon further questioning, he admits to not taking his dose of Depakote this evening.  He was given a dose here in the ER, but appears stable for discharge at this time.  He tells me he tried to see a neurologist recently, however got lost and was unable to make it to the appointment.  I will make another referral for him to follow-up.  Final Clinical Impression(s) / ED Diagnoses Final diagnoses:  None    Rx / DC Orders ED Discharge Orders     None        Geoffery Lyons, MD 07/05/21 [phone]

## 2021-07-05 NOTE — Discharge Instructions (Addendum)
Continue Depakote as previously prescribed.  Follow-up with neurology in the next week.  A referral has been placed for you to follow-up with [facility] neurology.  If you have not heard from them by Tuesday, call to make these arrangements.  Return to the emergency department in the meantime if you experience any new and/or concerning issues.

## 2021-07-05 NOTE — ED Triage Notes (Signed)
Pt BIB EMS with reports of pseudo seizures. EMS states that pt was shivering when they arrived but was able to talk to them the whole time. Pt states that he did not take his prescribed medication yesterday.

## 2021-08-17 ENCOUNTER — Ambulatory Visit: Payer: Medicaid Other | Admitting: Neurology

## 2021-08-18 ENCOUNTER — Encounter: Payer: Self-pay | Admitting: Neurology

## 2021-09-16 ENCOUNTER — Encounter (HOSPITAL_COMMUNITY): Payer: Self-pay | Admitting: *Deleted

## 2021-09-16 ENCOUNTER — Other Ambulatory Visit: Payer: Self-pay

## 2021-09-16 ENCOUNTER — Emergency Department (HOSPITAL_COMMUNITY)
Admission: EM | Admit: 2021-09-16 | Discharge: 2021-09-16 | Disposition: A | Discharge status: Home / Self Care | Payer: Medicaid Other | Attending: Emergency Medicine | Admitting: Emergency Medicine

## 2021-09-16 ENCOUNTER — Emergency Department (HOSPITAL_COMMUNITY): Payer: Medicaid Other

## 2021-09-16 DIAGNOSIS — S0081XA Abrasion of other part of head, initial encounter: Secondary | ICD-10-CM | POA: Diagnosis not present

## 2021-09-16 DIAGNOSIS — S3992XA Unspecified injury of lower back, initial encounter: Secondary | ICD-10-CM | POA: Diagnosis present

## 2021-09-16 DIAGNOSIS — S20229A Contusion of unspecified back wall of thorax, initial encounter: Secondary | ICD-10-CM | POA: Insufficient documentation

## 2021-09-16 DIAGNOSIS — X58XXXA Exposure to other specified factors, initial encounter: Secondary | ICD-10-CM | POA: Insufficient documentation

## 2021-09-16 DIAGNOSIS — T148XXA Other injury of unspecified body region, initial encounter: Secondary | ICD-10-CM

## 2021-09-16 DIAGNOSIS — Y9389 Activity, other specified: Secondary | ICD-10-CM | POA: Insufficient documentation

## 2021-09-16 DIAGNOSIS — G40909 Epilepsy, unspecified, not intractable, without status epilepticus: Secondary | ICD-10-CM | POA: Insufficient documentation

## 2021-09-16 LAB — I-STAT CHEM 8, ED
BUN: 15 mg/dL (ref 6–20)
Calcium, Ion: 1.19 mmol/L (ref 1.15–1.40)
Chloride: 105 mmol/L (ref 98–111)
Creatinine, Ser: 0.9 mg/dL (ref 0.61–1.24)
Glucose, Bld: 122 mg/dL — ABNORMAL HIGH (ref 70–99)
HCT: 44 % (ref 39.0–52.0)
Hemoglobin: 15 g/dL (ref 13.0–17.0)
Potassium: 3.8 mmol/L (ref 3.5–5.1)
Sodium: 139 mmol/L (ref 135–145)
TCO2: 25 mmol/L (ref 22–32)

## 2021-09-16 LAB — CBG MONITORING, ED: Glucose-Capillary: 128 mg/dL — ABNORMAL HIGH (ref 70–99)

## 2021-09-16 MED ORDER — SODIUM CHLORIDE 0.9 % IV SOLN: INTRAVENOUS | Status: DC | PRN

## 2021-09-16 MED ORDER — DIVALPROEX SODIUM ER 500 MG PO TB24: 1000.0000 mg | ORAL_TABLET | Freq: Every day | ORAL | 1 refills | Status: DC

## 2021-09-16 MED ORDER — DIVALPROEX SODIUM ER 500 MG PO TB24
1000.0000 mg | ORAL_TABLET | Freq: Every day | ORAL | Status: DC
  Administered 2021-09-16: 1000 mg via ORAL
  Filled 2021-09-16: qty 2

## 2021-09-16 MED ORDER — VALPROATE SODIUM 100 MG/ML IV SOLN
500.0000 mg | Freq: Once | INTRAVENOUS | Status: AC
  Administered 2021-09-16: 500 mg via INTRAVENOUS
  Filled 2021-09-16: qty 5

## 2021-09-16 MED ORDER — ACETAMINOPHEN 325 MG PO TABS
650.0000 mg | ORAL_TABLET | Freq: Once | ORAL | Status: AC
  Administered 2021-09-16: 650 mg via ORAL
  Filled 2021-09-16: qty 2

## 2021-09-16 MED ORDER — VALPROATE SODIUM 100 MG/ML IV SOLN
500.0000 mg | Freq: Once | INTRAVENOUS | Status: DC
  Filled 2021-09-16: qty 5

## 2021-09-16 NOTE — Discharge Instructions (Signed)
I sent in a refill of your seizure medication.  Make sure to take it regularly.  Follow-up with your neurologist as planned.  Return as needed for recurrent episodes

## 2021-09-16 NOTE — ED Provider Notes (Signed)
[facility]-EM[facility]ENCY DEPT Provider Note   CSN: [id number] Arrival date & time: 09/16/21  2024     History  Chief Complaint  Patient presents with   Seizures    Carl Calderon is a 21 y.o. male.   Seizures  Patient presents to the ED for evaluation of a seizure.  He has a history of seizure disorder and is supposed to be on Depakote.  Patient states he ran out of his medications a few days ago and has not been able to get a refill from his neurologist.  Patient was playing a computer game and suddenly had a seizure.  Patient was noted to be incontinent of urine he hit his head.  Now he is complaining some back pain.  Patient is now alert and awake.  He denies any recent fevers or chills.  No vomiting or diarrhea.  No other concerns.  Home Medications Prior to Admission medications   Medication Sig Start Date End Date Taking? Authorizing Provider  divalproex (DEPAKOTE ER) 500 MG 24 hr tablet Take 2 tablets (1,000 mg total) by mouth daily. 09/16/21 10/16/21  Linwood Dibbles, MD      Allergies    Patient has no known allergies.    Review of Systems   Review of Systems  Constitutional:  Negative for fever.  Neurological:  Positive for seizures.   Physical Exam Updated Vital Signs BP 119/76    Pulse 97    Temp 99.7 F (37.6 C) (Oral)    Resp 18    Wt 108.9 kg    SpO2 97%    BMI 32.55 kg/m  Physical Exam Vitals and nursing note reviewed.  Constitutional:      General: He is not in acute distress.    Appearance: He is well-developed.  HENT:     Head: Normocephalic.     Comments: Abrasion noted to the forehead    Right Ear: External ear normal.     Left Ear: External ear normal.  Eyes:     General: No scleral icterus.       Right eye: No discharge.        Left eye: No discharge.     Conjunctiva/sclera: Conjunctivae normal.  Neck:     Trachea: No tracheal deviation.  Cardiovascular:     Rate and Rhythm: Normal rate and regular rhythm.   Pulmonary:     Effort: Pulmonary effort is normal. No respiratory distress.     Breath sounds: Normal breath sounds. No stridor. No wheezing or rales.  Abdominal:     General: Bowel sounds are normal. There is no distension.     Palpations: Abdomen is soft.     Tenderness: There is no abdominal tenderness. There is no guarding or rebound.  Musculoskeletal:        General: No deformity.     Cervical back: Normal and neck supple.     Thoracic back: Tenderness present. No deformity.     Lumbar back: Tenderness present. No deformity.  Skin:    General: Skin is warm and dry.     Findings: No rash.  Neurological:     General: No focal deficit present.     Mental Status: He is alert.     Cranial Nerves: No cranial nerve deficit (no facial droop, extraocular movements intact, no slurred speech).     Sensory: No sensory deficit.     Motor: No abnormal muscle tone or seizure activity.     Coordination: Coordination  normal.  Psychiatric:        Mood and Affect: Mood normal.    ED Results / Procedures / Treatments   Labs (all labs ordered are listed, but only abnormal results are displayed) Labs Reviewed  CBG MONITORING, ED - Abnormal; Notable for the following components:      Result Value   Glucose-Capillary 128 (*)    All other components within normal limits  I-STAT CHEM 8, ED - Abnormal; Notable for the following components:   Glucose, Bld 122 (*)    All other components within normal limits  VALPROIC ACID LEVEL    EKG None  Radiology DG Thoracic Spine 2 View  Result Date: 09/16/2021 CLINICAL DATA:  Fall, pain EXAM: THORACIC SPINE 2 VIEWS COMPARISON:  None. FINDINGS: There is no evidence of thoracic spine fracture. Alignment is normal. No other significant bone abnormalities are identified. IMPRESSION: Negative. Electronically Signed   By: Charlett Nose M.D.   On: 09/16/2021 21:32   DG Lumbar Spine Complete  Result Date: 09/16/2021 CLINICAL DATA:  Fall, low back pain EXAM:  LUMBAR SPINE - COMPLETE 4+ VIEW COMPARISON:  None. FINDINGS: There is no evidence of lumbar spine fracture. Alignment is normal. Intervertebral disc spaces are maintained. IMPRESSION: Negative. Electronically Signed   By: Charlett Nose M.D.   On: 09/16/2021 21:32   DG Wrist Complete Right  Result Date: 09/16/2021 CLINICAL DATA:  Fall, wrist pain EXAM: RIGHT WRIST - COMPLETE 3+ VIEW COMPARISON:  None. FINDINGS: There is no evidence of fracture or dislocation. There is no evidence of arthropathy or other focal bone abnormality. Soft tissues are unremarkable. IMPRESSION: Negative. Electronically Signed   By: Charlett Nose M.D.   On: 09/16/2021 21:33    Procedures Procedures    Medications Ordered in ED Medications  valproate (DEPACON) 500 mg in dextrose 5 % 50 mL IVPB (has no administration in time range)  0.9 %  sodium chloride infusion (has no administration in time range)  divalproex (DEPAKOTE ER) 24 hr tablet 1,000 mg (has no administration in time range)  acetaminophen (TYLENOL) tablet 650 mg (650 mg Oral Given 09/16/21 2105)    ED Course/ Medical Decision Making/ A&P Clinical Course as of 09/16/21 2226  Wed Sep 16, 2021  2208 Wrist x-ray negative [JK]  2208 Lumbar spine x-ray negative [JK]  2208 Thoracic spine negative [JK]  2208 I-STAT without acute findings [JK]    Clinical Course User Index [JK] Linwood Dibbles, MD                           Medical Decision Making Amount and/or Complexity of Data Reviewed External Data Reviewed: notes.    Details: Neurology visit notes Labs: ordered. Decision-making details documented in ED Course. Radiology: ordered.  Risk OTC drugs. Prescription drug management.   Seizure disorder Patient presented with recurrent seizure.  He does have a history of this.  Patient had been out of his medications for a few days.  Patient returned back to baseline.  No signs of infection or neurologic dysfunction at this time.  Patient was given an IV loading  dose of Depakote.  I also gave him an oral dose of his daily medication.  Patient was monitored in the ED and no recurrent seizures.  Abrasion/contusion Patient did have an abrasion on his forehead as well as some pain in his lower back.  X-rays do not show any signs of fracture.  Injury is consistent with soft tissue  injury.  Patient can take over-the-counter medications as needed.  Follow-up as needed.        Final Clinical Impression(s) / ED Diagnoses Final diagnoses:  Seizure disorder (HCC)  Abrasion  Contusion of back, unspecified laterality, initial encounter    Rx / DC Orders ED Discharge Orders          Ordered    divalproex (DEPAKOTE ER) 500 MG 24 hr tablet  Daily        09/16/21 2225              Linwood Dibbles, MD 09/16/21 2228

## 2021-09-16 NOTE — ED Triage Notes (Addendum)
Pt with known seizure disorder has been out of his medications for 3 days due to pharmacy issue, pt attempted to reach his neurologist in regards to this but was unsuccessful.  Today pt had a seizure while gaming online. person that he was gaming with said that he went quiet and it sounded like pt was banging around so friend got in car and went to check on pt and found pt unconscious and with abrasion to forehead and having been incontinent of urine and called 911.  Pt was alert by the time ems go there. Pt reports lower back pain but is alert and oriented on arrival to ED

## 2021-09-16 NOTE — ED Notes (Signed)
Patient transported to X-ray 

## 2021-09-16 NOTE — ED Notes (Signed)
Seizure precautions in place, bed rails up and seizure pads applied

## 2021-09-17 ENCOUNTER — Telehealth (HOSPITAL_COMMUNITY): Payer: Self-pay | Admitting: Student

## 2021-09-17 MED ORDER — DIVALPROEX SODIUM ER 500 MG PO TB24: 1000.0000 mg | ORAL_TABLET | Freq: Every day | ORAL | 0 refills | Status: DC

## 2021-09-17 NOTE — Telephone Encounter (Signed)
Patient was seen yesterday and prescribed divalproex 1000 mg daily, he went to pick up prescription today and pharmacy reports they do not have prescription, this has been resent to the [facility] on [address].

## 2022-04-30 ENCOUNTER — Emergency Department (HOSPITAL_COMMUNITY)
Admission: EM | Admit: 2022-04-30 | Discharge: 2022-05-01 | Disposition: A | Discharge status: Home / Self Care | Payer: Medicaid Other | Attending: Emergency Medicine | Admitting: Emergency Medicine

## 2022-04-30 ENCOUNTER — Other Ambulatory Visit: Payer: Self-pay

## 2022-04-30 ENCOUNTER — Emergency Department (HOSPITAL_COMMUNITY): Payer: Medicaid Other

## 2022-04-30 DIAGNOSIS — R000 Tachycardia, unspecified: Secondary | ICD-10-CM | POA: Insufficient documentation

## 2022-04-30 DIAGNOSIS — R569 Unspecified convulsions: Secondary | ICD-10-CM | POA: Insufficient documentation

## 2022-04-30 DIAGNOSIS — M542 Cervicalgia: Secondary | ICD-10-CM | POA: Insufficient documentation

## 2022-04-30 DIAGNOSIS — M25511 Pain in right shoulder: Secondary | ICD-10-CM | POA: Insufficient documentation

## 2022-04-30 LAB — COMPREHENSIVE METABOLIC PANEL
ALT: 51 U/L — ABNORMAL HIGH (ref 0–44)
AST: 34 U/L (ref 15–41)
Albumin: 4.3 g/dL (ref 3.5–5.0)
Alkaline Phosphatase: 53 U/L (ref 38–126)
Anion gap: 10 (ref 5–15)
BUN: 10 mg/dL (ref 6–20)
CO2: 24 mmol/L (ref 22–32)
Calcium: 9.6 mg/dL (ref 8.9–10.3)
Chloride: 106 mmol/L (ref 98–111)
Creatinine, Ser: 1.07 mg/dL (ref 0.61–1.24)
GFR, Estimated: >60 mL/min (ref >60)
Glucose, Bld: 100 mg/dL — ABNORMAL HIGH (ref 70–99)
Potassium: 4.1 mmol/L (ref 3.5–5.1)
Sodium: 140 mmol/L (ref 135–145)
Total Bilirubin: 1.5 mg/dL — ABNORMAL HIGH (ref 0.3–1.2)
Total Protein: 7.1 g/dL (ref 6.5–8.1)

## 2022-04-30 LAB — CBC WITH DIFFERENTIAL/PLATELET
Abs Immature Granulocytes: 0.02 10*3/uL (ref 0.00–0.07)
Basophils Absolute: 0 10*3/uL (ref 0.0–0.1)
Basophils Relative: 1 %
Eosinophils Absolute: 0.1 10*3/uL (ref 0.0–0.5)
Eosinophils Relative: 1 %
HCT: 42.9 % (ref 39.0–52.0)
Hemoglobin: 15.2 g/dL (ref 13.0–17.0)
Immature Granulocytes: 0 %
Lymphocytes Relative: 21 %
Lymphs Abs: 1.3 10*3/uL (ref 0.7–4.0)
MCH: 29.6 pg (ref 26.0–34.0)
MCHC: 35.4 g/dL (ref 30.0–36.0)
MCV: 83.6 fL (ref 80.0–100.0)
Monocytes Absolute: 0.4 10*3/uL (ref 0.1–1.0)
Monocytes Relative: 6 %
Neutro Abs: 4.4 10*3/uL (ref 1.7–7.7)
Neutrophils Relative %: 71 %
Platelets: 223 10*3/uL (ref 150–400)
RBC: 5.13 MIL/uL (ref 4.22–5.81)
RDW: 12.3 % (ref 11.5–15.5)
WBC: 6.2 10*3/uL (ref 4.0–10.5)
nRBC: 0 % (ref 0.0–0.2)

## 2022-04-30 LAB — MAGNESIUM: Magnesium: 2.5 mg/dL — ABNORMAL HIGH (ref 1.7–2.4)

## 2022-04-30 MED ORDER — DIVALPROEX SODIUM ER 500 MG PO TB24: 1000.0000 mg | ORAL_TABLET | Freq: Every day | ORAL | 0 refills | Status: AC

## 2022-04-30 MED ORDER — VALPROATE SODIUM 100 MG/ML IV SOLN
500.0000 mg | Freq: Once | INTRAVENOUS | Status: AC
  Administered 2022-04-30: 500 mg via INTRAVENOUS
  Filled 2022-04-30: qty 5

## 2022-04-30 MED ORDER — KETOROLAC TROMETHAMINE 15 MG/ML IJ SOLN
15.0000 mg | Freq: Once | INTRAMUSCULAR | Status: AC
  Administered 2022-04-30: 15 mg via INTRAVENOUS
  Filled 2022-04-30: qty 1

## 2022-04-30 MED ORDER — ACETAMINOPHEN 500 MG PO TABS: 1000.0000 mg | ORAL_TABLET | Freq: Four times a day (QID) | ORAL | Status: DC | PRN

## 2022-04-30 MED ORDER — LACTATED RINGERS IV BOLUS
1000.0000 mL | Freq: Once | INTRAVENOUS | Status: AC
  Administered 2022-04-30: 1000 mL via INTRAVENOUS

## 2022-04-30 MED ORDER — DIVALPROEX SODIUM ER 500 MG PO TB24
1000.0000 mg | ORAL_TABLET | Freq: Once | ORAL | Status: AC
  Administered 2022-04-30: 1000 mg via ORAL
  Filled 2022-04-30: qty 2

## 2022-04-30 NOTE — ED Triage Notes (Signed)
Pt BIB EMS after having a seizure and falling and hitting his head. Patient's mother stated to EMS that he ran out of his Depakote 3 days ago, and has not gotten it refilled. Pt sustained a laceration to his R axillary. Patient was vomiting prior to EMS arrival and on route, EMS administered 4mg  of zofran. Patient does complain of head, neck and R arm pain 10/10.  VSS w/ EMS.

## 2022-04-30 NOTE — Discharge Instructions (Addendum)
You have a scapular fracture on the right side.  Sling for comfort.  Please follow-up with orthopedics.  I provided permission below to call and schedule an appointment.   Follow-up with your neurologist.  Continue your home seizure medications.  Return to the ED as needed.  Stay well-hydrated.  Use Tylenol/Motrin as needed for discomfort.

## 2022-04-30 NOTE — ED Notes (Signed)
Called ortho regarding placement of shoulder sling for this patient.

## 2022-04-30 NOTE — ED Provider Notes (Signed)
[facility] EMERGENCY DEPARTMENT Provider Note   CSN: [id number] Arrival date & time: 04/30/22  1803     History  Chief Complaint  Patient presents with   Seizures    Carl Calderon is a 21 y.o. male. Presenting after seizure that occurred sitting at home at the kitchen table.  Witnessed by sister and mother.  He does have history of prior seizures and has been out of his medication for the past 3 days and has not taken it.  He takes Depakote. He was sitting at a high top table and had only tensed bilateral upper extremities and fell out of the chair onto his right side as he began having full body convulsions.  Family states this appears similar to patient's prior seizure.  It lasted approximately 5 minutes and resolved without intervention. Patient denies any preceding symptoms including fever, recent illness, nausea, vomiting, decreased p.o. day, or trauma. Patient was in normal state of health just prior to the event. He now reports history of headache and right shoulder pain.  HPI     Home Medications Prior to Admission medications   Medication Sig Start Date End Date Taking? Authorizing Provider  divalproex (DEPAKOTE ER) 500 MG 24 hr tablet Take 2 tablets (1,000 mg total) by mouth daily. 04/30/22 05/30/22  Kela Millin, MD      Allergies    Patient has no known allergies.    Review of Systems   Review of Systems  Constitutional:  Negative for chills and fever.  HENT:  Negative for ear pain and sore throat.   Eyes:  Negative for pain and visual disturbance.  Respiratory:  Negative for cough and shortness of breath.   Cardiovascular:  Negative for chest pain and palpitations.  Gastrointestinal:  Negative for abdominal pain and vomiting.  Genitourinary:  Negative for dysuria and hematuria.  Musculoskeletal:  Negative for arthralgias and back pain.  Skin:  Negative for color change and rash.  Neurological:  Positive for seizures and  headaches. Negative for syncope.  All other systems reviewed and are negative.   Physical Exam Updated Vital Signs BP 123/67   Pulse 86   Temp 98.7 F (37.1 C) (Oral)   Resp 20   SpO2 95%  Physical Exam Vitals and nursing note reviewed.  Constitutional:      General: He is not in acute distress.    Appearance: He is well-developed.  HENT:     Head: Normocephalic and atraumatic.     Comments: Tenderness to posterior scalp, no deformity Eyes:     Conjunctiva/sclera: Conjunctivae normal.  Cardiovascular:     Rate and Rhythm: Normal rate and regular rhythm.     Heart sounds: No murmur heard. Pulmonary:     Effort: Pulmonary effort is normal. No respiratory distress.     Breath sounds: Normal breath sounds.  Abdominal:     Palpations: Abdomen is soft.     Tenderness: There is no abdominal tenderness.  Musculoskeletal:        General: Tenderness (Right posterior shoulder) present. No swelling.     Cervical back: Neck supple. Tenderness (left paraspinal tenderness, no midline tenderness) present.  Skin:    General: Skin is warm and dry.     Capillary Refill: Capillary refill takes less than 2 seconds.  Neurological:     General: No focal deficit present.     Mental Status: He is alert and oriented to person, place, and time. Mental status is at baseline.  Psychiatric:        Mood and Affect: Mood normal.     ED Results / Procedures / Treatments   Labs (all labs ordered are listed, but only abnormal results are displayed) Labs Reviewed  COMPREHENSIVE METABOLIC PANEL - Abnormal; Notable for the following components:      Result Value   Glucose, Bld 100 (*)    ALT 51 (*)    Total Bilirubin 1.5 (*)    All other components within normal limits  MAGNESIUM - Abnormal; Notable for the following components:   Magnesium 2.5 (*)    All other components within normal limits  CBC WITH DIFFERENTIAL/PLATELET    EKG EKG Interpretation  Date/Time:  Friday April 30 2022  18:23:56 EDT Ventricular Rate:  108 PR Interval:  169 QRS Duration: 109 QT Interval:  324 QTC Calculation: 435 R Axis:   61 Text Interpretation: Sinus tachycardia Consider left atrial enlargement RSR' in V1 or V2, right VCD or RVH ST elevation, consider anterolateral injury No significant change since last tracing Confirmed by Vanetta Mulders [phone]) on 04/30/2022 8:21:35 PM  Radiology DG Shoulder Right  Result Date: 04/30/2022 CLINICAL DATA:  Seizure; right shoulder pain EXAM: RIGHT SHOULDER - 2+ VIEW COMPARISON:  None Available. FINDINGS: Lucency through the spine of the scapula extending towards the acromion suspicious for mildly displaced fracture. No definite involvement of the glenohumeral joint. No fracture or dislocation of the right humerus. Soft tissues are unremarkable. IMPRESSION: Acute mildly displaced fracture of the right superior scapula. Electronically Signed   By: Minerva Fester M.D.   On: 04/30/2022 19:28    Procedures Procedures    Medications Ordered in ED Medications  acetaminophen (TYLENOL) tablet 1,000 mg (has no administration in time range)  divalproex (DEPAKOTE ER) 24 hr tablet 1,000 mg (1,000 mg Oral Given 04/30/22 1838)  lactated ringers bolus 1,000 mL (0 mLs Intravenous Stopped 04/30/22 2316)  valproate (DEPACON) 500 mg in dextrose 5 % 50 mL IVPB (0 mg Intravenous Stopped 04/30/22 2316)  ketorolac (TORADOL) 15 MG/ML injection 15 mg (15 mg Intravenous Given 04/30/22 2146)    ED Course/ Medical Decision Making/ A&P                           Medical Decision Making Amount and/or Complexity of Data Reviewed Labs: ordered. Radiology: ordered.  Risk OTC drugs. Prescription drug management.   21 year old male with past medical history of migraines and seizures on Depakote presenting after a seizure in the setting of medication noncompliance x3 days. Arrival, patient is back to mental status baseline and has stable vital signs. At bedside witnessed the  seizure and states that it does look similar to prior seizures.  Differential diagnosis includes medication noncompliance, migraine, tension headache, electrolyte abnormalities, AKI, fracture, dislocation.  Patient given loading dose of home medication Depakote along with IV fluids and Tylenol.  Labs reassuring.  No leukocytosis, anemia, significant electrolyte abnormalities, or AKI. X-ray right shoulder showed scapular fracture.  Patient placed in sling, Toradol given. Patient given information to follow-up with orthopedics. On reevaluation, patient remains at his mental status baseline.  He states he will follow-up with his neurologist.  He was given additional prescription for home medication.  I emphasized the importance of medication compliance.  He voiced understanding.  Patient felt comfortable discharge at this time.  Strict return precautions given.        Final Clinical Impression(s) / ED Diagnoses Final diagnoses:  Seizure (HCC)  Rx / DC Orders ED Discharge Orders          Ordered    divalproex (DEPAKOTE ER) 500 MG 24 hr tablet  Daily        04/30/22 2258              Rosine Abe, MD 04/30/22 9833    Fredia Sorrow, MD 05/07/22 [phone]

## 2022-05-01 NOTE — Progress Notes (Signed)
Orthopedic Tech Progress Note Patient Details:  Carl Calderon 2001/07/28 [id number]  Ortho Devices Type of Ortho Device: Shoulder immobilizer Ortho Device/Splint Location: rue Ortho Device/Splint Interventions: Ordered, Application, Adjustment   Post Interventions Patient Tolerated: Well  [name] 05/01/2022, 2:24 AM

## 2022-11-02 ENCOUNTER — Emergency Department (HOSPITAL_COMMUNITY)
Admission: EM | Admit: 2022-11-02 | Discharge: 2022-11-02 | Disposition: A | Discharge status: Home / Self Care | Payer: BC Managed Care – PPO | Attending: Emergency Medicine | Admitting: Emergency Medicine

## 2022-11-02 ENCOUNTER — Encounter (HOSPITAL_COMMUNITY): Payer: Self-pay

## 2022-11-02 ENCOUNTER — Other Ambulatory Visit: Payer: Self-pay

## 2022-11-02 DIAGNOSIS — G40909 Epilepsy, unspecified, not intractable, without status epilepticus: Secondary | ICD-10-CM

## 2022-11-02 DIAGNOSIS — R569 Unspecified convulsions: Secondary | ICD-10-CM | POA: Insufficient documentation

## 2022-11-02 LAB — CBC WITH DIFFERENTIAL/PLATELET
Abs Immature Granulocytes: 0.03 10*3/uL (ref 0.00–0.07)
Basophils Absolute: 0 10*3/uL (ref 0.0–0.1)
Basophils Relative: 1 %
Eosinophils Absolute: 0.1 10*3/uL (ref 0.0–0.5)
Eosinophils Relative: 2 %
HCT: 44.1 % (ref 39.0–52.0)
Hemoglobin: 15.6 g/dL (ref 13.0–17.0)
Immature Granulocytes: 1 %
Lymphocytes Relative: 36 %
Lymphs Abs: 2.1 10*3/uL (ref 0.7–4.0)
MCH: 30.2 pg (ref 26.0–34.0)
MCHC: 35.4 g/dL (ref 30.0–36.0)
MCV: 85.3 fL (ref 80.0–100.0)
Monocytes Absolute: 0.5 10*3/uL (ref 0.1–1.0)
Monocytes Relative: 9 %
Neutro Abs: 3 10*3/uL (ref 1.7–7.7)
Neutrophils Relative %: 51 %
Platelets: 219 10*3/uL (ref 150–400)
RBC: 5.17 MIL/uL (ref 4.22–5.81)
RDW: 12.1 % (ref 11.5–15.5)
WBC: 5.8 10*3/uL (ref 4.0–10.5)
nRBC: 0 % (ref 0.0–0.2)

## 2022-11-02 LAB — BASIC METABOLIC PANEL
Anion gap: 9 (ref 5–15)
BUN: 10 mg/dL (ref 6–20)
CO2: 24 mmol/L (ref 22–32)
Calcium: 9.3 mg/dL (ref 8.9–10.3)
Chloride: 105 mmol/L (ref 98–111)
Creatinine, Ser: 0.88 mg/dL (ref 0.61–1.24)
GFR, Estimated: >60 mL/min (ref >60)
Glucose, Bld: 109 mg/dL — ABNORMAL HIGH (ref 70–99)
Potassium: 3.6 mmol/L (ref 3.5–5.1)
Sodium: 138 mmol/L (ref 135–145)

## 2022-11-02 LAB — VALPROIC ACID LEVEL: Valproic Acid Lvl: 43 ug/mL — ABNORMAL LOW (ref 50.0–100.0)

## 2022-11-02 MED ORDER — DIVALPROEX SODIUM 500 MG PO DR TAB
500.0000 mg | DELAYED_RELEASE_TABLET | Freq: Once | ORAL | Status: AC
  Administered 2022-11-02: 500 mg via ORAL
  Filled 2022-11-02: qty 1

## 2022-11-02 NOTE — ED Provider Notes (Signed)
[facility] EMERGENCY DEPARTMENT AT [facility] Provider Note   CSN: [id number] Arrival date & time: 11/02/22  0256     History  Chief Complaint  Patient presents with   Seizures    Carl Calderon is a 22 y.o. male.  Patient presents to the emergency department via EMS after reported seizure witnessed by his grandmother.  Patient states he has been taking his medication (Depakote) as prescribed.  He denies any falls or injuries.  Denies incontinence, biting his tongue.  EMS states that upon their arrival the patient was not seizing but was alert and oriented x 1 and appeared postictal.  Upon arrival at the emergency department the patient is conscious alert and oriented x 4.  He denies any symptoms at this time including fever, recent illness, nausea, vomiting, decreased oral intake, trauma.  HPI     Home Medications Prior to Admission medications   Medication Sig Start Date End Date Taking? Authorizing Provider  divalproex (DEPAKOTE ER) 500 MG 24 hr tablet Take 2 tablets (1,000 mg total) by mouth daily. 04/30/22 05/30/22  Rosine Abe, MD      Allergies    Patient has no known allergies.    Review of Systems   Review of Systems  Neurological:  Positive for seizures.    Physical Exam Updated Vital Signs BP (!) 138/108 (BP Location: Left Arm)   Pulse (!) 108   Temp 98.6 F (37 C) (Oral)   Resp 18   Ht 6' (1.829 m)   Wt 111.1 kg   SpO2 94%   BMI 33.23 kg/m  Physical Exam Vitals and nursing note reviewed.  Constitutional:      General: He is not in acute distress.    Appearance: He is well-developed.  HENT:     Head: Normocephalic and atraumatic.  Eyes:     Conjunctiva/sclera: Conjunctivae normal.  Cardiovascular:     Rate and Rhythm: Normal rate and regular rhythm.     Heart sounds: No murmur heard. Pulmonary:     Effort: Pulmonary effort is normal. No respiratory distress.     Breath sounds: Normal breath sounds.  Abdominal:      Palpations: Abdomen is soft.     Tenderness: There is no abdominal tenderness.  Musculoskeletal:        General: No swelling.     Cervical back: Neck supple.  Skin:    General: Skin is warm and dry.     Capillary Refill: Capillary refill takes less than 2 seconds.  Neurological:     General: No focal deficit present.     Mental Status: He is alert and oriented to person, place, and time.  Psychiatric:        Mood and Affect: Mood normal.     ED Results / Procedures / Treatments   Labs (all labs ordered are listed, but only abnormal results are displayed) Labs Reviewed  VALPROIC ACID LEVEL - Abnormal; Notable for the following components:      Result Value   Valproic Acid Lvl 43 (*)    All other components within normal limits  BASIC METABOLIC PANEL - Abnormal; Notable for the following components:   Glucose, Bld 109 (*)    All other components within normal limits  CBC WITH DIFFERENTIAL/PLATELET  URINALYSIS, ROUTINE W REFLEX MICROSCOPIC    EKG EKG Interpretation  Date/Time:  Tuesday November 02 2022 03:10:09 EDT Ventricular Rate:  101 PR Interval:  168 QRS Duration: 106 QT Interval:  339 QTC Calculation: 440 R Axis:   89 Text Interpretation: Sinus tachycardia Consider right ventricular hypertrophy Lateral infarct, acute No significant change was found Confirmed by Shanon Rosser [phone]) on 11/02/2022 3:16:39 AM  Radiology No results found.  Procedures Procedures    Medications Ordered in ED Medications  divalproex (DEPAKOTE) DR tablet 500 mg (500 mg Oral Given 11/02/22 0316)    ED Course/ Medical Decision Making/ A&P                             Medical Decision Making Amount and/or Complexity of Data Reviewed Labs: ordered.  Risk Prescription drug management.   Patient presents with a chief complaint of a seizure.  Differential diagnosis includes but is not limited to medication noncompliance, migraines, fever, kidney injury, and others  The patient is  comorbidities including history of known seizure disorder.  I reviewed medical records including emergency department notes from September of this past year when the patient was seen in the emergency department for a witnessed seizure  I ordered and reviewed labs.  BMP, CBC grossly unremarkable.  There is no indication at this time for head CT  I ordered and reviewed EKG which showed a sinus rhythm with no significant changes from previous EKG  The patient was given a dose of Depakote.  I made sure that his home medications were up-to-date  Patient has had no further seizure activity while here in the emergency department.  Plan to discharge home with instructions for close outpatient follow-up with neurology.  Patient voices agreement with plan.  Return precautions provided.        Final Clinical Impression(s) / ED Diagnoses Final diagnoses:  Seizure disorder [facility])    Rx / DC Orders ED Discharge Orders     None         Ronny Bacon 11/02/22 0439    Shanon Rosser, MD 11/02/22 [phone]

## 2022-11-02 NOTE — ED Notes (Signed)
Patient given discharge instructions and follow up care. Patient verbalized understanding. Patient taken out of ED via wheelchair with family.

## 2022-11-02 NOTE — ED Triage Notes (Signed)
Patient brought in by [facility] for seizure. Patient has hx of seizures and takes depakote, states he hasn't missed any doses. EMS reports upon their arrival patient was postictal, A&Ox1, upon arrival here patient A&Ox4. Patient vomited x2 pta, received 4mg  zofran.

## 2022-11-02 NOTE — Discharge Instructions (Signed)
You were seen tonight after an apparent seizure. Please be sure to continue taking your Depakote as prescribed. Please be aware that you are not to drive for 6 months or until neurology clears you. Please follow up with neurology

## 2023-03-09 IMAGING — CT CT CERVICAL SPINE W/O CM
3 of 4 series · 12 of 33 positions shown, 14 images · non-contrast
Comparison: None.

CLINICAL DATA: Head injury after seizure and fall.

EXAM:
CT HEAD WITHOUT CONTRAST
CT CERVICAL SPINE WITHOUT CONTRAST
TECHNIQUE: Multidetector CT imaging of the head and cervical spine was
performed following the standard protocol without intravenous
contrast. Multiplanar CT image reconstructions of the cervical spine
were also generated.

[Series 6: orthogonal bone · axial · 0.23mm/px · z∈[-344,-224]mm · 4 of 97 slices shown, 5 images]
[im 17/97  soft-tissue]
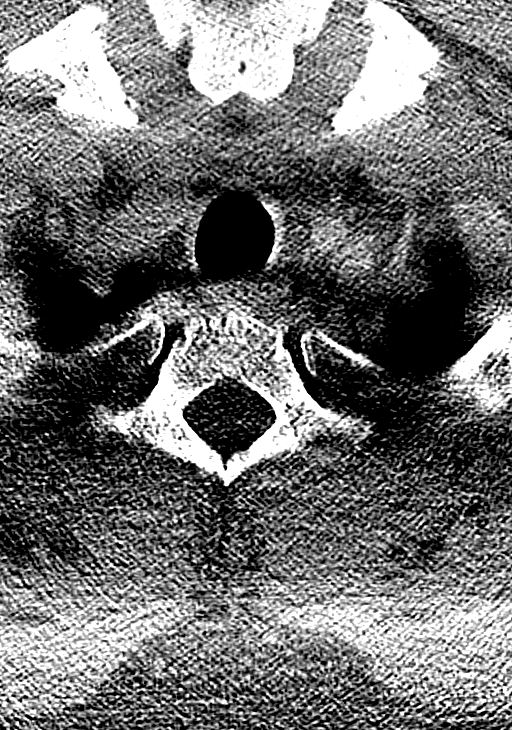
[im 17/97  bone]
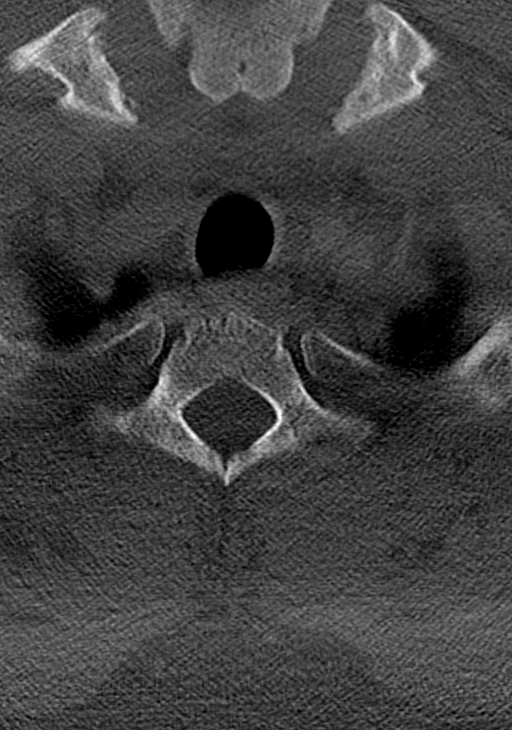
[im 33/97  bone]
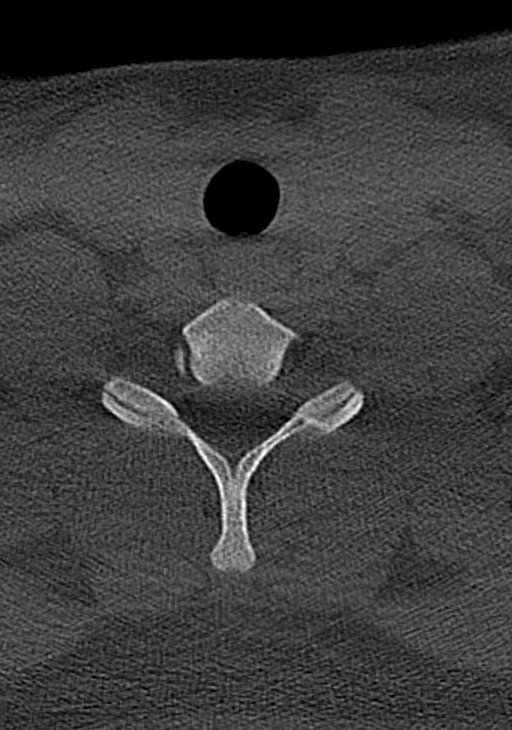
[im 65/97  bone]
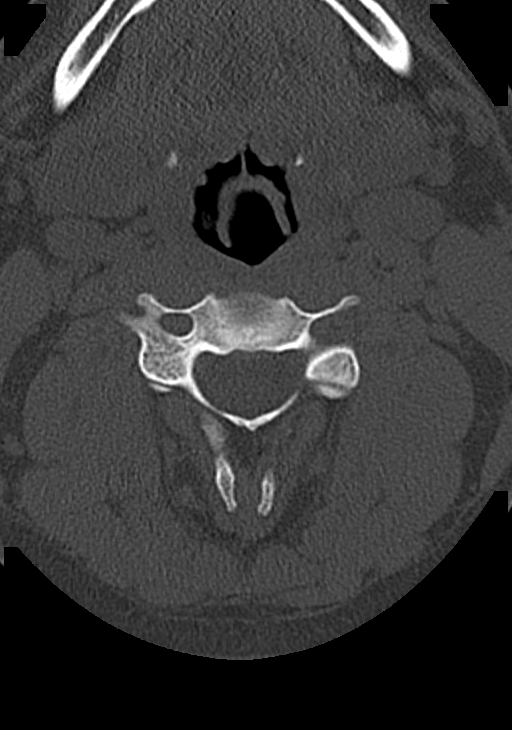
[im 81/97  bone]
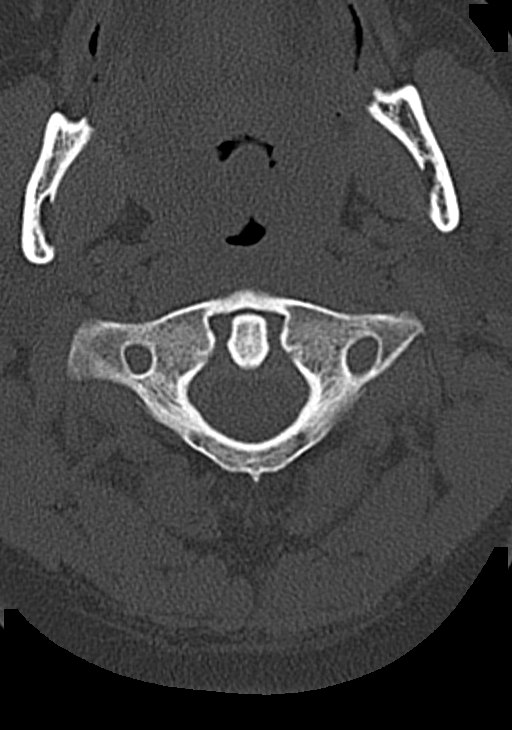

[Series 7: coronal bone · coronal · 0.29mm/px · 3 of 79 slices shown]
[im 19/79  bone]
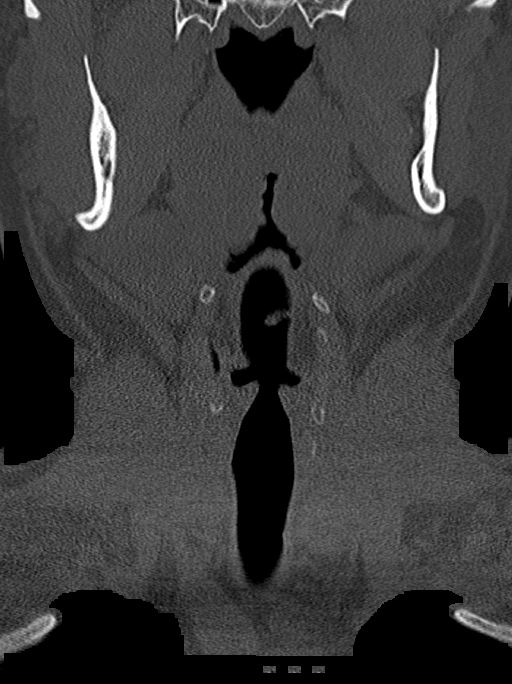
[im 33/79  bone]
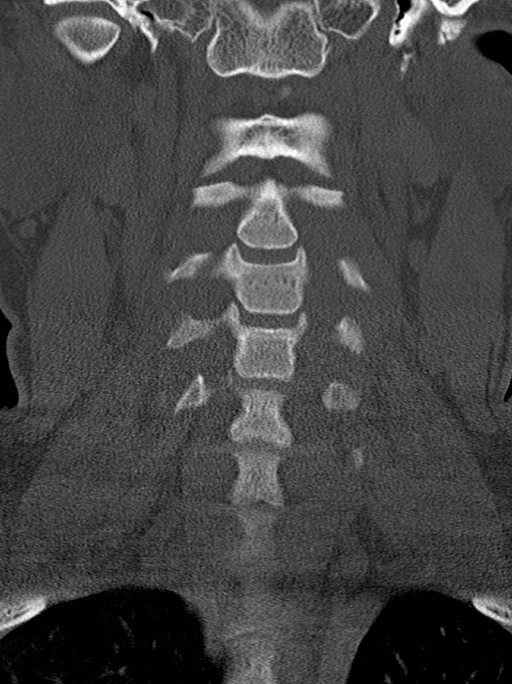
[im 47/79  bone]
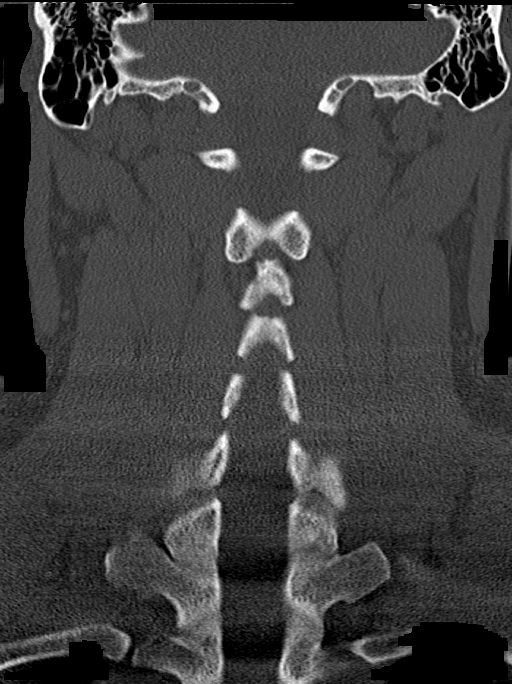

[Series 8: sagittal bone · sagittal · 0.34mm/px · 5 of 61 slices shown, 6 images]
[im 21/61  bone]
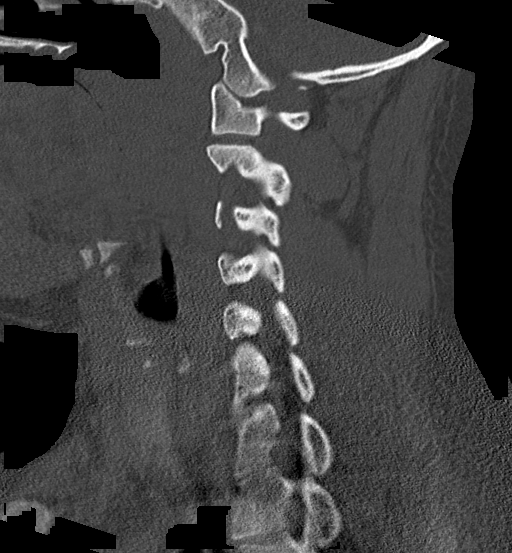
[im 26/61  bone]
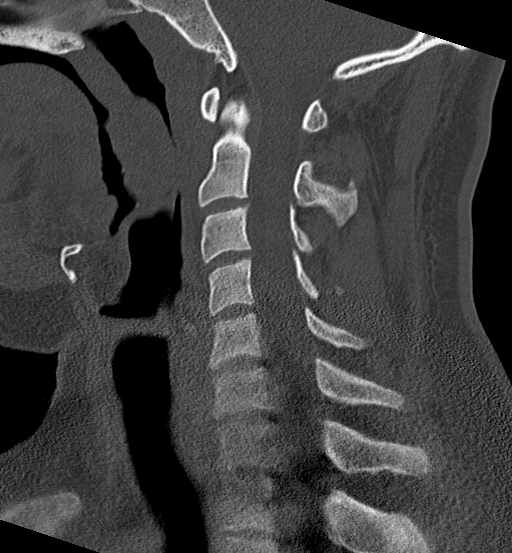
[im 31/61  soft-tissue]
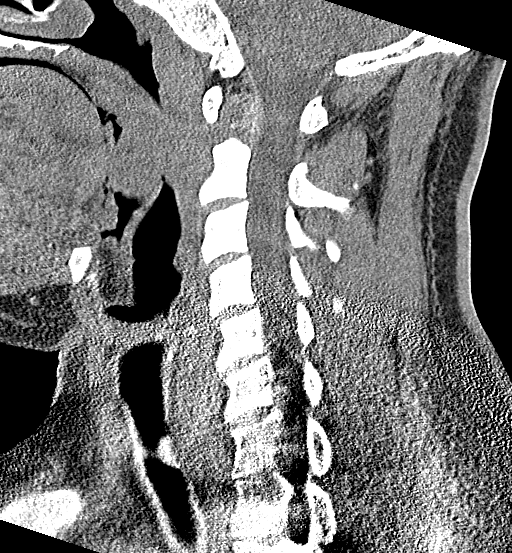
[im 31/61  bone]
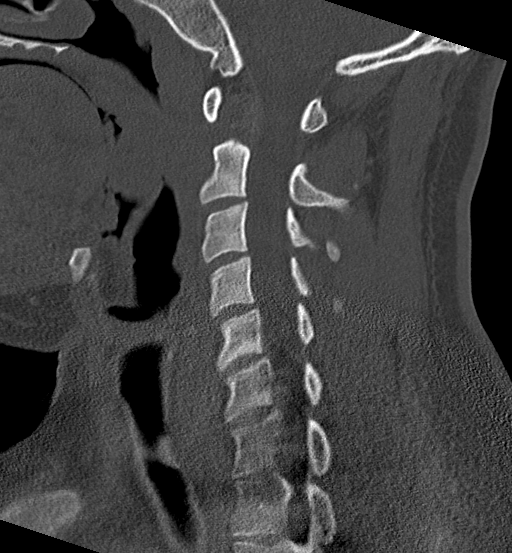
[im 36/61  bone]
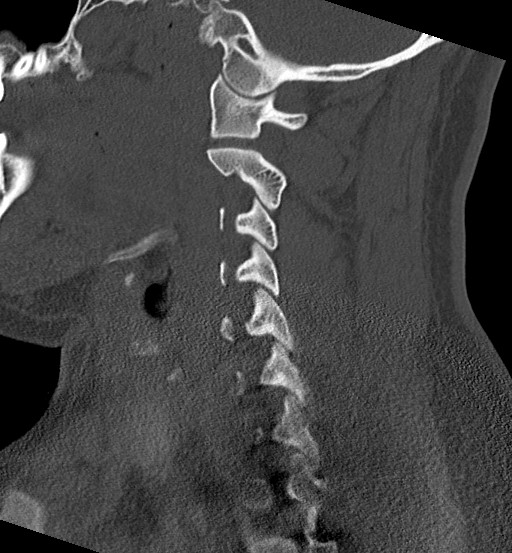
[im 41/61  bone]
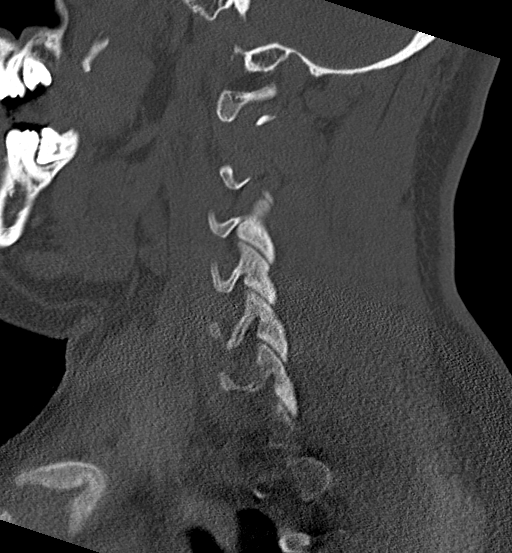

[12 of 33 positions shown; findings below may reference images not displayed]

FINDINGS: CT HEAD FINDINGS

Brain: No evidence of acute infarction, hemorrhage, hydrocephalus,
extra-axial collection or mass lesion/mass effect.

Vascular: No hyperdense vessel or unexpected calcification.

Skull: Normal. Negative for fracture or focal lesion.

Sinuses/Orbits: No acute finding.

Other: None.

CT CERVICAL SPINE FINDINGS

Alignment: Normal.

Skull base and vertebrae: No acute fracture. No primary bone lesion
or focal pathologic process.

Soft tissues and spinal canal: No prevertebral fluid or swelling. No
visible canal hematoma.

Disc levels:  Normal.

Upper chest: Negative.

Other: None.
IMPRESSION: Normal head CT.

Normal cervical spine.
